# Patient Record
Sex: Male | Born: 1965 | Race: White | Hispanic: No | Marital: Married | Smoking: Never smoker
Health system: Southern US, Community
[De-identification: ages and names within clinical notes are randomized; demographics above are authoritative.]

## PROBLEM LIST (undated history)

## (undated) DIAGNOSIS — G8929 Other chronic pain: Secondary | ICD-10-CM

## (undated) DIAGNOSIS — E781 Pure hyperglyceridemia: Secondary | ICD-10-CM

## (undated) DIAGNOSIS — R42 Dizziness and giddiness: Secondary | ICD-10-CM

## (undated) DIAGNOSIS — M542 Cervicalgia: Secondary | ICD-10-CM

## (undated) HISTORY — DX: Dizziness and giddiness: R42

## (undated) HISTORY — PX: OTHER SURGICAL HISTORY: SHX169

## (undated) HISTORY — DX: Other chronic pain: G89.29

## (undated) HISTORY — DX: Cervicalgia: M54.2

## (undated) HISTORY — DX: Pure hyperglyceridemia: E78.1

---

## 1998-01-20 ENCOUNTER — Encounter: Admission: RE | Admit: 1998-01-20 | Discharge: 1998-04-20 | Payer: Self-pay | Admitting: Neurosurgery

## 2002-11-26 ENCOUNTER — Ambulatory Visit (HOSPITAL_COMMUNITY): Admission: RE | Admit: 2002-11-26 | Discharge: 2002-11-26 | Payer: Self-pay | Admitting: Emergency Medicine

## 2002-11-26 ENCOUNTER — Encounter: Payer: Self-pay | Admitting: Emergency Medicine

## 2004-02-14 ENCOUNTER — Emergency Department (HOSPITAL_COMMUNITY): Admission: EM | Admit: 2004-02-14 | Discharge: 2004-02-15 | Payer: Self-pay | Admitting: Emergency Medicine

## 2006-08-27 HISTORY — PX: COLONOSCOPY: SHX174

## 2007-09-10 ENCOUNTER — Emergency Department (HOSPITAL_COMMUNITY): Admission: EM | Admit: 2007-09-10 | Discharge: 2007-09-10 | Payer: Self-pay | Admitting: Emergency Medicine

## 2009-09-22 ENCOUNTER — Emergency Department (HOSPITAL_COMMUNITY): Admission: EM | Admit: 2009-09-22 | Discharge: 2009-09-22 | Payer: Self-pay | Admitting: Emergency Medicine

## 2010-10-19 ENCOUNTER — Ambulatory Visit
Admission: RE | Admit: 2010-10-19 | Discharge: 2010-10-19 | Payer: Self-pay | Source: Home / Self Care | Admitting: Family Medicine

## 2010-10-21 ENCOUNTER — Telehealth (INDEPENDENT_AMBULATORY_CARE_PROVIDER_SITE_OTHER): Payer: Self-pay | Admitting: *Deleted

## 2010-10-24 ENCOUNTER — Ambulatory Visit
Admission: RE | Admit: 2010-10-24 | Discharge: 2010-10-24 | Payer: Self-pay | Source: Home / Self Care | Admitting: Family Medicine

## 2010-11-23 NOTE — Assessment & Plan Note (Signed)
Summary: REMOVE SUTURES/TJ (procedure RM)   Vital Signs:  Patient Profile:   45 Years Old Male CC:      suture removal Height:     72 inches Weight:      186.50 pounds O2 Sat:      98 % O2 treatment:    Room Air Temp:     97.7 degrees F oral Pulse rate:   61 / minute Resp:     18 per minute BP sitting:   135 / 90  (left arm) Cuff size:   regular  Vitals Entered By: Clemens Catholic LPN (October 24, 2010 5:13 PM)                  Updated Prior Medication List: MULTIVITAMINS  TABS (MULTIPLE VITAMIN)   Current Allergies (reviewed today): No known allergies History of Present Illness Chief Complaint: suture removal History of Present Illness:  Subjective:  No complaints  REVIEW OF SYSTEMS Constitutional Symptoms      Denies fever, chills, night sweats, weight loss, weight gain, and fatigue.  Eyes       Denies change in vision, eye pain, eye discharge, glasses, contact lenses, and eye surgery. Ear/Nose/Throat/Mouth       Denies hearing loss/aids, change in hearing, ear pain, ear discharge, dizziness, frequent runny nose, frequent nose bleeds, sinus problems, sore throat, hoarseness, and tooth pain or bleeding.  Respiratory       Denies dry cough, productive cough, wheezing, shortness of breath, asthma, bronchitis, and emphysema/COPD.  Cardiovascular       Denies murmurs, chest pain, and tires easily with exhertion.    Gastrointestinal       Denies stomach pain, nausea/vomiting, diarrhea, constipation, blood in bowel movements, and indigestion. Genitourniary       Denies painful urination, kidney stones, and loss of urinary control. Neurological       Denies paralysis, seizures, and fainting/blackouts. Musculoskeletal       Denies muscle pain, joint pain, joint stiffness, decreased range of motion, redness, swelling, muscle weakness, and gout.  Skin       Denies bruising, unusual mles/lumps or sores, and hair/skin or nail changes.  Psych       Denies mood changes,  temper/anger issues, anxiety/stress, speech problems, depression, and sleep problems. Other Comments: pt is here today to have his sutures removed from his RT eye lid.   Past History:  Past Medical History: Reviewed history from 10/19/2010 and no changes required. Unremarkable  Past Surgical History: Reviewed history from 10/19/2010 and no changes required. right wrist repair right ankle right shoulder  Family History: Reviewed history from 10/19/2010 and no changes required. None  Social History: Reviewed history from 10/19/2010 and no changes required. Married Never Smoked Alcohol use-no Drug use-no Regular exercise-yes   Objective:  Right upper eyelid:  no swelling, erythema, or drainage.  Laceration well healed Assessment New Problems: ENCOUNTER FOR REMOVAL OF SUTURES (ICD-V58.32)   Plan New Orders: No Charge Patient Arrived (NCPA0) [NCPA0] Planning Comments:   Sutures removed.  Applied small amount of Bacitracin to healed wound.   The patient and/or caregiver has been counseled thoroughly with regard to medications prescribed including dosage, schedule, interactions, rationale for use, and possible side effects and they verbalize understanding.  Diagnoses and expected course of recovery discussed and will return if not improved as expected or if the condition worsens. Patient and/or caregiver verbalized understanding.   Orders Added: 1)  No Charge Patient Arrived (NCPA0) [NCPA0]

## 2010-11-23 NOTE — Assessment & Plan Note (Signed)
Summary: CUT TO R EYE LID/WB (proc. rm)   Vital Signs:  Patient Profile:   45 Years Old Male CC:      laceration to right eye lid x this am Height:     72 inches Weight:      183 pounds O2 Sat:      100 % O2 treatment:    Room Air Temp:     97.6 degrees F oral Pulse rate:   66 / minute Resp:     12 per minute BP sitting:   123 / 78  (left arm) Cuff size:   regular  Vitals Entered By: Lajean Saver RN (October 19, 2010 8:27 AM)              Vision Screening: Left eye w/o correction: 20 / 20 Right Eye w/o correction: 20 / 20 Both eyes w/o correction:  20/ 20  Color vision testing: normal      Vision Entered By: Lajean Saver RN (October 19, 2010 8:28 AM)    Updated Prior Medication List: MULTIVITAMINS  TABS (MULTIPLE VITAMIN)   Current Allergies: No known allergies History of Present Illness Chief Complaint: laceration to right eye lid x this am History of Present Illness:  Subjective:  While playing basketball this morning, patient was elbowed over the right eye, resulting in laceration of the right upper eyelid.  No changes in vision.  He believes that his last tetanus shot was within 5 years.  REVIEW OF SYSTEMS Constitutional Symptoms      Denies fever, chills, night sweats, weight loss, weight gain, and fatigue.  Eyes       Denies change in vision, eye pain, eye discharge, glasses, contact lenses, and eye surgery.      Comments: laceration to right eye lid Ear/Nose/Throat/Mouth       Denies hearing loss/aids, change in hearing, ear pain, ear discharge, dizziness, frequent runny nose, frequent nose bleeds, sinus problems, sore throat, hoarseness, and tooth pain or bleeding.  Respiratory       Denies dry cough, productive cough, wheezing, shortness of breath, asthma, bronchitis, and emphysema/COPD.  Cardiovascular       Denies murmurs, chest pain, and tires easily with exhertion.    Gastrointestinal       Denies stomach pain, nausea/vomiting, diarrhea,  constipation, blood in bowel movements, and indigestion. Genitourniary       Denies painful urination, kidney stones, and loss of urinary control. Neurological       Denies paralysis, seizures, and fainting/blackouts. Musculoskeletal       Denies muscle pain, joint pain, joint stiffness, decreased range of motion, redness, swelling, muscle weakness, and gout.  Skin       Denies bruising, unusual mles/lumps or sores, and hair/skin or nail changes.  Psych       Denies mood changes, temper/anger issues, anxiety/stress, speech problems, depression, and sleep problems. Other Comments: patient was elbowed in his right eye while playing basketball this AM. bleeding has stopped and the wound has been cleaned with hibicleanse   Past History:  Past Medical History: Unremarkable  Past Surgical History: right wrist repair right ankle right shoulder  Family History: None  Social History: Married Never Smoked Alcohol use-no Drug use-no Regular exercise-yes Smoking Status:  never Does Patient Exercise:  yes Drug Use:  no   Objective:  Appearance:  Patient appears healthy, stated age, and in no acute distress  Eyes:  Pupils are equal, round, and reactive to light and accomdation.  Extraocular movement is  intact.  Conjunctivae are not inflamed, and no injury apparent.  Fundi normal. Skin, face:  Right upper eyelid reveals a 1.5cm simple shallow laceration.  No facial swelling present.  Wound clean without debris Assessment New Problems: LACERATION, EYELID (ICD-870.0)   Plan New Orders: New Patient Level III [99203] Repair Superfical Wound(s) 2.5cm or< (face,ears,eyelids,nose,lips,mm) [12011] Planning Comments:   Return for any signs of infection.  Keep wound clean and dry.  Apply Bacitracin several times daily. Advised to check with his PCP for status of last tetanus immunization and return if not current. Return in 6 days for suture removal   The patient and/or caregiver has  been counseled thoroughly with regard to medications prescribed including dosage, schedule, interactions, rationale for use, and possible side effects and they verbalize understanding.  Diagnoses and expected course of recovery discussed and will return if not improved as expected or if the condition worsens. Patient and/or caregiver verbalized understanding.   PROCEDURE:  Suture Site: Right upper eyelid Size: 1.5cm Number of Lacerations: 1 Anesthesia: Local 1% lidocaine without epinephrine Procedure: Procedure:  Laceration Repair Discussed benefits and risks of procedure and verbal consent obtained. Using sterile technique and local 1% lidocaine without epinephrine, cleansed wound with Betadine followed by lavage with normal saline.  Wound carefully inspected for debris and foreign bodies; none found.  Wound closed with #4, 6-0 interrupted nylon sutures.  Bacitracin applied.  Wound precautions explained to patient.    Disposition: Home  Orders Added: 1)  New Patient Level III [81448] 2)  Repair Superfical Wound(s) 2.5cm or< (face,ears,eyelids,nose,lips,mm) [12011]

## 2010-11-23 NOTE — Progress Notes (Signed)
  Phone Note Outgoing Call   Call placed by: Clemens Catholic LPN,  October 21, 2010 6:03 PM Call placed to: Patient Summary of Call: call back: called to F/U with pt. he states that his eye is doing better. advised him to F/U 6 days after injury for suture removal. Initial call taken by: Clemens Catholic LPN,  October 21, 2010 6:04 PM

## 2014-09-21 ENCOUNTER — Encounter: Payer: Self-pay | Admitting: *Deleted

## 2014-09-22 ENCOUNTER — Encounter: Payer: Self-pay | Admitting: Cardiology

## 2014-09-22 ENCOUNTER — Ambulatory Visit (INDEPENDENT_AMBULATORY_CARE_PROVIDER_SITE_OTHER): Payer: 59 | Admitting: Cardiology

## 2014-09-22 VITALS — BP 144/98 | HR 66 | Resp 18 | Ht 71.0 in | Wt 186.4 lb

## 2014-09-22 DIAGNOSIS — E781 Pure hyperglyceridemia: Secondary | ICD-10-CM

## 2014-09-22 DIAGNOSIS — R079 Chest pain, unspecified: Secondary | ICD-10-CM

## 2014-09-22 NOTE — Patient Instructions (Addendum)
Your physician has requested that you have an exercise tolerance test. For further information please visit [email or web address]. Please also follow instruction sheet, as given.  Your physician has requested that you have an echocardiogram. Echocardiography is a painless test that uses sound waves to create images of your heart. It provides your doctor with information about the size and shape of your heart and how well your heart's chambers and valves are working. This procedure takes approximately one hour. There are no restrictions for this procedure.  Your physician recommends that you schedule a follow-up appointment AS NEEDED with Dr. Turner.   

## 2014-09-22 NOTE — Progress Notes (Signed)
  [address] [place], [place]  [address] Phone: [phone] Fax:  [phone]  Date:  09/22/2014   ID:  Tony Brady, DOB 1966/03/06, MRN [id number]  PCP:  Darrow BussingKOIRALA,DIBAS, MD  Cardiologist:  Armanda Magicraci Turner, MD    History of Present Illness: Tony Brady is a 48 y.o. male with a history of hypertriglyceridemia who recently was his PCP for wellness exam and complained of occasional CP that is dull and nonexertional.  It will be constant for a week at a time and then go away. This has been occurring for  Years since his 3020's.  There are no associated symptoms of nausea, diaphoresis or SOB.  EKG showed NSR with no ST changes.  He says that the discomfort is always on his left side.  He plays basketball and has no problems with CP.  He denies any SOB, DOE, palpitations, LE edema or syncope.   Wt Readings from Last 3 Encounters:  09/22/14 186 lb 6.4 oz (84.55 kg)  10/24/10 186 lb 8 oz (84.596 kg)  10/19/10 183 lb (83.008 kg)     Past Medical History  Diagnosis Date  . Chronic neck pain   . High triglycerides   . Vertigo     Current Outpatient Prescriptions  Medication Sig Dispense Refill  . Multiple Vitamins-Minerals (MULTIVITAMIN PO) Take 1 tablet by mouth daily.    . tadalafil (CIALIS) 10 MG tablet Take 10 mg by mouth daily as needed for erectile dysfunction.     No current facility-administered medications for this visit.   Allergies:   No Known Allergies  Social History:  The patient  reports that he has never smoked. He does not have any smokeless tobacco history on file. He reports that he drinks alcohol.   Family History:  The patient's family history includes COPD in his mother; Cancer in his father; Colon cancer in his mother; Emphysema in his father; Pneumonia in his father.   ROS:  Please see the history of present illness.      All other systems reviewed and negative.   PHYSICAL EXAM: VS:  There were no vitals taken for this visit. Well nourished, well  developed, in no acute distress HEENT: normal Neck: no JVD Cardiac:  normal S1, S2; RRR; no murmur Lungs:  clear to auscultation bilaterally, no wheezing, rhonchi or rales Abd: soft, nontender, no hepatomegaly Ext: no edema Skin: warm and dry Neuro:  CNs 2-12 intact, no focal abnormalities noted  ASSESSMENT/PLAN:  1. Atypical chest pain with CRF including hypertriglyceridemia, male sex and age 78>40.  His EKG is nonischemic. He has a history of gastropathy so this could be related to GI etiology.   Will get an ETT to rule out ischemia and check a 2D echo to assess LVF. 2.   Hypertriglyceridemia  Followup with me PRN pending results of studies  Signed, Armanda Magicraci Turner, MD [facility] 09/22/2014 4:01 PM

## 2014-10-04 ENCOUNTER — Ambulatory Visit (HOSPITAL_COMMUNITY): Payer: 59 | Attending: Cardiology

## 2014-10-04 DIAGNOSIS — I340 Nonrheumatic mitral (valve) insufficiency: Secondary | ICD-10-CM | POA: Insufficient documentation

## 2014-10-04 DIAGNOSIS — R079 Chest pain, unspecified: Secondary | ICD-10-CM | POA: Diagnosis present

## 2014-10-04 NOTE — Progress Notes (Signed)
2D Echo completed. 10/04/2014 

## 2014-10-06 ENCOUNTER — Telehealth: Payer: Self-pay | Admitting: General Practice

## 2014-10-06 ENCOUNTER — Telehealth: Payer: Self-pay | Admitting: Cardiology

## 2014-10-06 NOTE — Telephone Encounter (Signed)
New Msg      Pt returning call, Please contact at [phone].

## 2014-10-06 NOTE — Telephone Encounter (Signed)
Patient informed of ECHO results and verbal understanding expressed.   

## 2014-10-27 ENCOUNTER — Telehealth (HOSPITAL_COMMUNITY): Payer: Self-pay

## 2014-10-27 NOTE — Telephone Encounter (Signed)
Encounter complete. 

## 2014-10-29 ENCOUNTER — Ambulatory Visit (HOSPITAL_COMMUNITY)
Admission: RE | Admit: 2014-10-29 | Discharge: 2014-10-29 | Disposition: A | Discharge status: Home / Self Care | Payer: 59 | Source: Ambulatory Visit | Attending: Cardiology | Admitting: Cardiology

## 2014-10-29 DIAGNOSIS — R079 Chest pain, unspecified: Secondary | ICD-10-CM | POA: Diagnosis present

## 2014-10-29 NOTE — Procedures (Signed)
Exercise Treadmill Test Test  Exercise Tolerance Test Ordering MD: Armanda Magicraci Turner, MD    Unique Test No: 1  Treadmill:  1  Indication for ETT: chest pain - rule out ischemia  Contraindication to ETT: No   Stress Modality: exercise - treadmill  Cardiac Imaging Performed: non   Protocol: standard Bruce - maximal  Max BP:  192/79/  Max MPHR (bpm):  171 85% MPR (bpm):  145  MPHR obtained (bpm):  164 % MPHR obtained:  95  Reached 85% MPHR (min:sec):  Total Exercise Time (min-sec):  14  Workload in METS:  17.2 Borg Scale: 15  Reason ETT Terminated:  Fatigue and SOB    ST Segment Analysis At Rest: NSR, RVCD With Exercise: no evidence of significant ST depression  Other Information Arrhythmia:  Isolated ventricular couplet. Angina during ETT:  absent (0) Quality of ETT:  diagnostic  ETT Interpretation:  normal - no evidence of ischemia by ST analysis  Comments: ETT with excellent exercise tolerance (14:00); normal BP response; no chest pain; no ST changes; negative adequate ETT. Tony MillersBrian Kainoa Brady

## 2014-11-21 ENCOUNTER — Emergency Department: Admission: EM | Admit: 2014-11-21 | Discharge: 2014-11-21 | Discharge status: Absent without leave | Payer: Self-pay

## 2016-10-01 ENCOUNTER — Encounter: Payer: Self-pay | Admitting: *Deleted

## 2016-10-01 ENCOUNTER — Emergency Department
Admission: EM | Admit: 2016-10-01 | Discharge: 2016-10-01 | Disposition: A | Discharge status: Home / Self Care | Payer: BLUE CROSS/BLUE SHIELD | Source: Home / Self Care | Attending: Family Medicine | Admitting: Family Medicine

## 2016-10-01 DIAGNOSIS — B9789 Other viral agents as the cause of diseases classified elsewhere: Secondary | ICD-10-CM

## 2016-10-01 DIAGNOSIS — J069 Acute upper respiratory infection, unspecified: Secondary | ICD-10-CM

## 2016-10-01 MED ORDER — BENZONATATE 100 MG PO CAPS: 100.0000 mg | ORAL_CAPSULE | Freq: Three times a day (TID) | ORAL | 0 refills | Status: DC

## 2016-10-01 MED ORDER — AZITHROMYCIN 250 MG PO TABS: 250.0000 mg | ORAL_TABLET | Freq: Every day | ORAL | 0 refills | Status: DC

## 2016-10-01 MED ORDER — GUAIFENESIN ER 600 MG PO TB12: 600.0000 mg | ORAL_TABLET | Freq: Two times a day (BID) | ORAL | 0 refills | Status: DC

## 2016-10-01 MED ORDER — FLUTICASONE PROPIONATE 50 MCG/ACT NA SUSP: 2.0000 | Freq: Every day | NASAL | 2 refills | Status: DC

## 2016-10-01 NOTE — Discharge Instructions (Signed)
°  Your symptoms are likely due to a virus such as the common cold, however, if you developing worsening chest congestion with shortness of breath, persistent fever for 3 days, or symptoms not improving in 4-5 days, you may fill the antibiotic (azithromycin).  If you do fill the antibiotic,  please take antibiotics as prescribed and be sure to complete entire course even if you start to feel better to ensure infection does not come back. ° °

## 2016-10-01 NOTE — ED Triage Notes (Signed)
Patient c/o 5 days of dry cough and low grade fever @ night. Taken generic allergy relief and tylenol otc.

## 2016-10-01 NOTE — ED Provider Notes (Signed)
CSN: [id number]     Arrival date & time 10/01/16  1152 History   First MD Initiated Contact with Patient 10/01/16 1211     Chief Complaint  Patient presents with  . Cough   (Consider location/radiation/quality/duration/timing/severity/associated sxs/prior Treatment) HPI  Tony Brady is a 50 y.o. male presenting to UC with c/o 5 days of mildly productive cough that is worse at night.  Subjective low-grade fever at night.  He has taken generic allergy relief and Tylenol with some relief. Denies nasal congestion as he notes he has been taking the allergy medication. He notes his wife was sick a few weeks ago but is better now. Denies n/v/d. Denies chest pain or SOB. Denies ear pain or sore throat.    Past Medical History:  Diagnosis Date  . Chronic neck pain   . High triglycerides   . Vertigo    Past Surgical History:  Procedure Laterality Date  . COLONOSCOPY  08/27/2006   normal   . GE junction     Erythematous gastropathy   Family History  Problem Relation Age of Onset  . Colon cancer Mother   . COPD Mother   . Pneumonia Father   . Cancer Father   . Emphysema Father    Social History  Substance Use Topics  . Smoking status: Never Smoker  . Smokeless tobacco: Never Used  . Alcohol use Yes     Comment: rare, one glass a year    Review of Systems  Constitutional: Positive for fever ( subjective). Negative for chills.  HENT: Positive for congestion ( minimal). Negative for ear pain, sore throat, trouble swallowing and voice change.   Respiratory: Positive for cough. Negative for shortness of breath.   Cardiovascular: Negative for chest pain and palpitations.  Gastrointestinal: Negative for abdominal pain, diarrhea, nausea and vomiting.  Musculoskeletal: Negative for arthralgias, back pain and myalgias.  Skin: Negative for rash.    Allergies  Patient has no known allergies.  Home Medications   Prior to Admission medications   Medication Sig Start Date End Date  Taking? Authorizing Provider  azithromycin (ZITHROMAX) 250 MG tablet Take 1 tablet (250 mg total) by mouth daily. Take first 2 tablets together, then 1 every day until finished. 10/01/16   Junius FinnerErin O'Malley, PA-C  benzonatate (TESSALON) 100 MG capsule Take 1-2 capsules (100-200 mg total) by mouth every 8 (eight) hours. 10/01/16   Junius FinnerErin O'Malley, PA-C  fluticasone (FLONASE) 50 MCG/ACT nasal spray Place 2 sprays into both nostrils daily. 10/01/16   Junius FinnerErin O'Malley, PA-C  guaiFENesin (MUCINEX) 600 MG 12 hr tablet Take 1-2 tablets (600-1,200 mg total) by mouth 2 (two) times daily. Take with large glass of water 10/01/16   Junius FinnerErin O'Malley, PA-C   Meds Ordered and Administered this Visit  Medications - No data to display  BP 137/80 (BP Location: Left Arm)   Pulse 66   Temp 97.7 F (36.5 C) (Oral)   Resp 16   Wt 183 lb (83 kg)   SpO2 96%   BMI 25.52 kg/m  No data found.   Physical Exam  Constitutional: He is oriented to person, place, and time. He appears well-developed and well-nourished. No distress.  HENT:  Head: Normocephalic and atraumatic.  Right Ear: Tympanic membrane normal.  Left Ear: Tympanic membrane normal.  Nose: Nose normal. Right sinus exhibits no maxillary sinus tenderness and no frontal sinus tenderness. Left sinus exhibits no maxillary sinus tenderness and no frontal sinus tenderness.  Mouth/Throat: Uvula is midline, oropharynx is clear  and moist and mucous membranes are normal.  Eyes: EOM are normal.  Neck: Normal range of motion. Neck supple.  Cardiovascular: Normal rate and regular rhythm.   Pulmonary/Chest: Effort normal and breath sounds normal. No respiratory distress. He has no wheezes. He has no rales.  Musculoskeletal: Normal range of motion.  Neurological: He is alert and oriented to person, place, and time.  Skin: Skin is warm and dry. He is not diaphoretic.  Psychiatric: He has a normal mood and affect. His behavior is normal.  Nursing note and vitals  reviewed.   Urgent Care Course   Clinical Course     Procedures (including critical care time)  Labs Review Labs Reviewed - No data to display  Imaging Review No results found.   MDM   1. Viral URI with cough    Pt c/o 5 days of minimally productive cough and mild congestion.  No evidence of bacterial infection at this time. Reassured pt symptoms are likely viral.  Encouraged symptomatic treatment.  Rx: Tessalon, Flonase, and Mucinex Prescription to hold with expiration date for Azithromycin provided. F/u with PCP in 7-10 days if not improving, sooner if worsening.     Junius Finnerrin O'Malley, PA-C 10/01/16 1232

## 2016-10-29 ENCOUNTER — Emergency Department
Admission: EM | Admit: 2016-10-29 | Discharge: 2016-10-29 | Disposition: A | Discharge status: Home / Self Care | Payer: BLUE CROSS/BLUE SHIELD | Source: Home / Self Care | Attending: Family Medicine | Admitting: Family Medicine

## 2016-10-29 DIAGNOSIS — J029 Acute pharyngitis, unspecified: Secondary | ICD-10-CM | POA: Diagnosis not present

## 2016-10-29 LAB — POCT RAPID STREP A (OFFICE): Rapid Strep A Screen: NEGATIVE

## 2016-10-29 NOTE — ED Triage Notes (Signed)
Started Friday with sore throat, fever, chills.  Has been taking tylenol.  Sunday had upset stomach and hard to swallow.

## 2016-10-29 NOTE — Discharge Instructions (Signed)
°  Your symptoms are likely viral in nature.  It is important to stay well hydrated.  You may alternate acetaminophen and ibuprofen as needed for fever and pain.  You may also try over the counter chloraseptic throat spray or throat lozenges to help with throat pain. You may run a humidifier at night to help prevent your throat from becoming dry, which could cause more pain.  A soft diet with warm broth, ice chips, ice pops, jello, and soft noodles are good to have while your throat is still sore.

## 2016-10-29 NOTE — ED Provider Notes (Signed)
CSN: [id number]     Arrival date & time 10/29/16  0944 History   First MD Initiated Contact with Patient 10/29/16 1011     Chief Complaint  Patient presents with  . Sore Throat  . Fever   (Consider location/radiation/quality/duration/timing/severity/associated sxs/prior Treatment) HPI Frankey ShownRichard Zilberman is a 51 y.o. male presenting to UC with c/o sore throat, subjective fever with chills, generalized headache and nausea for 3 days.  He has been taking Tylenol with mild temporary relief.  Throat pain is worse with swallowing but he has been able to keep down fluids.  Denies vomiting or diarrhea. Others at work have been sick. No recent travel.    Past Medical History:  Diagnosis Date  . Chronic neck pain   . High triglycerides   . Vertigo    Past Surgical History:  Procedure Laterality Date  . COLONOSCOPY  08/27/2006   normal   . GE junction     Erythematous gastropathy   Family History  Problem Relation Age of Onset  . Colon cancer Mother   . COPD Mother   . Pneumonia Father   . Cancer Father   . Emphysema Father    Social History  Substance Use Topics  . Smoking status: Never Smoker  . Smokeless tobacco: Never Used  . Alcohol use Yes     Comment: rare, one glass a year    Review of Systems  Constitutional: Positive for chills, fatigue and fever.  HENT: Positive for sore throat. Negative for congestion, ear pain, rhinorrhea, sinus pain and sinus pressure.   Respiratory: Negative for cough and shortness of breath.   Gastrointestinal: Positive for nausea. Negative for diarrhea and vomiting.  Musculoskeletal: Negative for arthralgias and joint swelling.  Neurological: Positive for headaches. Negative for dizziness and light-headedness.    Allergies  Patient has no known allergies.  Home Medications   Prior to Admission medications   Medication Sig Start Date End Date Taking? Authorizing Provider  azithromycin (ZITHROMAX) 250 MG tablet Take 1 tablet (250 mg total) by  mouth daily. Take first 2 tablets together, then 1 every day until finished. 10/01/16   Junius FinnerErin O'Malley, PA-C  benzonatate (TESSALON) 100 MG capsule Take 1-2 capsules (100-200 mg total) by mouth every 8 (eight) hours. 10/01/16   Junius FinnerErin O'Malley, PA-C  fluticasone (FLONASE) 50 MCG/ACT nasal spray Place 2 sprays into both nostrils daily. 10/01/16   Junius FinnerErin O'Malley, PA-C  guaiFENesin (MUCINEX) 600 MG 12 hr tablet Take 1-2 tablets (600-1,200 mg total) by mouth 2 (two) times daily. Take with large glass of water 10/01/16   Junius FinnerErin O'Malley, PA-C   Meds Ordered and Administered this Visit  Medications - No data to display  BP 137/84 (BP Location: Left Arm)   Pulse 67   Temp 98.2 F (36.8 C) (Oral)   Ht 5\' 11"  (1.803 m)   Wt 183 lb 6.4 oz (83.2 kg)   SpO2 98%   BMI 25.58 kg/m  No data found.   Physical Exam  Constitutional: He is oriented to person, place, and time. He appears well-developed and well-nourished. No distress.  HENT:  Head: Normocephalic and atraumatic.  Right Ear: Tympanic membrane normal.  Left Ear: Tympanic membrane normal.  Nose: Nose normal.  Mouth/Throat: Uvula is midline and mucous membranes are normal. Posterior oropharyngeal erythema present. No oropharyngeal exudate, posterior oropharyngeal edema or tonsillar abscesses.  Eyes: EOM are normal.  Neck: Normal range of motion. Neck supple.  Cardiovascular: Normal rate and regular rhythm.   Pulmonary/Chest: Effort normal  and breath sounds normal. No stridor. No respiratory distress. He has no wheezes. He has no rales.  Musculoskeletal: Normal range of motion.  Lymphadenopathy:    He has cervical adenopathy.  Neurological: He is alert and oriented to person, place, and time.  Skin: Skin is warm and dry. He is not diaphoretic.  Psychiatric: He has a normal mood and affect. His behavior is normal.  Nursing note and vitals reviewed.   Urgent Care Course   Clinical Course     Procedures (including critical care  time)  Labs Review Labs Reviewed  STREP A DNA PROBE  POCT RAPID STREP A (OFFICE)    Imaging Review No results found.    MDM   1. Viral pharyngitis    Pt c/o sore throat, headache, and subjective fever for about 3 days.  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   No evidence of tonsillar abscess.  Rapid strep: Negative Will send culture.   Encouraged symptomatic treatment for viral illness. Fluids, acetaminophen, ibuprofen, salt water gargles, and throat lozenges.  F/u with PCP in 1 week if not improving.    Junius Finner, PA-C 10/29/16 1125

## 2016-10-30 ENCOUNTER — Telehealth: Payer: Self-pay | Admitting: *Deleted

## 2016-10-30 LAB — STREP A DNA PROBE: GASP: NOT DETECTED

## 2016-10-30 NOTE — Telephone Encounter (Signed)
LM with Tcx results and to call back if he has any questions or concerns. Tarisa Paola, LPN  

## 2017-09-06 ENCOUNTER — Other Ambulatory Visit: Payer: Self-pay

## 2017-09-06 ENCOUNTER — Emergency Department
Admission: EM | Admit: 2017-09-06 | Discharge: 2017-09-06 | Disposition: A | Discharge status: Home / Self Care | Payer: BLUE CROSS/BLUE SHIELD | Source: Home / Self Care | Attending: Family Medicine | Admitting: Family Medicine

## 2017-09-06 ENCOUNTER — Emergency Department (INDEPENDENT_AMBULATORY_CARE_PROVIDER_SITE_OTHER): Payer: BLUE CROSS/BLUE SHIELD

## 2017-09-06 ENCOUNTER — Encounter: Payer: Self-pay | Admitting: Emergency Medicine

## 2017-09-06 DIAGNOSIS — X58XXXA Exposure to other specified factors, initial encounter: Secondary | ICD-10-CM

## 2017-09-06 DIAGNOSIS — S8252XA Displaced fracture of medial malleolus of left tibia, initial encounter for closed fracture: Secondary | ICD-10-CM | POA: Diagnosis not present

## 2017-09-06 DIAGNOSIS — S99912A Unspecified injury of left ankle, initial encounter: Secondary | ICD-10-CM | POA: Diagnosis not present

## 2017-09-06 NOTE — ED Provider Notes (Signed)
[facility]    CSN: [id number] Arrival date & time: 09/06/17  1101     History   Chief Complaint Chief Complaint  Patient presents with  . Ankle Pain    HPI Tony Brady is a 51 y.o. male.   HPI Tony Brady is a 51 y.o. male presenting to UC with c/o Left ankle pain that started about 11 weeks ago while playing basketball. He has injured it several times in the past so he initially thought he had a mild sprain. He used an ankle brace and crutches for a few weeks. Pain started to improve but then when he went back to playing basketball his ankle started to hurt again, despite using an ankle brace.  He is requesting an x-ray to make sure he does not have a stress fracture.    Past Medical History:  Diagnosis Date  . Chronic neck pain   . High triglycerides   . Vertigo     Patient Active Problem List   Diagnosis Date Noted  . Chest pain 09/22/2014  . Hypertriglyceridemia 09/22/2014    Past Surgical History:  Procedure Laterality Date  . COLONOSCOPY  08/27/2006   normal   . GE junction     Erythematous gastropathy       Home Medications    Prior to Admission medications   Medication Sig Start Date End Date Taking? Authorizing Provider  azithromycin (ZITHROMAX) 250 MG tablet Take 1 tablet (250 mg total) by mouth daily. Take first 2 tablets together, then 1 every day until finished. 10/01/16   Lurene ShadowPhelps, Saud Bail O, PA-C  benzonatate (TESSALON) 100 MG capsule Take 1-2 capsules (100-200 mg total) by mouth every 8 (eight) hours. 10/01/16   Lurene ShadowPhelps, Rebekah Zackery O, PA-C  fluticasone (FLONASE) 50 MCG/ACT nasal spray Place 2 sprays into both nostrils daily. 10/01/16   Lurene ShadowPhelps, Shey Bartmess O, PA-C  guaiFENesin (MUCINEX) 600 MG 12 hr tablet Take 1-2 tablets (600-1,200 mg total) by mouth 2 (two) times daily. Take with large glass of water 10/01/16   Lurene ShadowPhelps, Zanobia Griebel O, PA-C    Family History Family History  Problem Relation Age of Onset  . Colon cancer Mother   . COPD Mother   .  Pneumonia Father   . Cancer Father   . Emphysema Father     Social History Social History   Tobacco Use  . Smoking status: Never Smoker  . Smokeless tobacco: Never Used  Substance Use Topics  . Alcohol use: Yes    Comment: rare, one glass a year  . Drug use: Not on file     Allergies   Patient has no known allergies.   Review of Systems Review of Systems  Musculoskeletal: Positive for arthralgias, gait problem, joint swelling and myalgias.  Skin: Negative for color change and wound.  Neurological: Negative for weakness and numbness.     Physical Exam Triage Vital Signs ED Triage Vitals  Enc Vitals Group     BP 09/06/17 1121 134/87     Pulse Rate 09/06/17 1121 64     Resp --      Temp 09/06/17 1121 97.8 F (36.6 C)     Temp Source 09/06/17 1121 Oral     SpO2 09/06/17 1121 98 %     Weight 09/06/17 1122 185 lb (83.9 kg)     Height --      Head Circumference --      Peak Flow --      Pain Score 09/06/17 1122 3  Pain Loc --      Pain Edu? --      Excl. in GC? --    No data found.  Updated Vital Signs BP 134/87 (BP Location: Right Arm)   Pulse 64   Temp 97.8 F (36.6 C) (Oral)   Wt 185 lb (83.9 kg)   SpO2 98%   BMI 25.80 kg/m   Visual Acuity Right Eye Distance:   Left Eye Distance:   Bilateral Distance:    Right Eye Near:   Left Eye Near:    Bilateral Near:     Physical Exam  Constitutional: He is oriented to person, place, and time. He appears well-developed and well-nourished. No distress.  HENT:  Head: Normocephalic and atraumatic.  Eyes: EOM are normal.  Neck: Normal range of motion.  Cardiovascular: Normal rate.  Pulses:      Dorsalis pedis pulses are 2+ on the left side.       Posterior tibial pulses are 2+ on the left side.  Pulmonary/Chest: Effort normal.  Musculoskeletal: Normal range of motion. He exhibits edema and tenderness.  Left ankle and foot: minimal edema to anterior aspect of ankle. tenderness to anterior ankle. Full  ROM w/o crepitus. Calf is soft, non-tender. Full ROM knee, ankle and toes   Neurological: He is alert and oriented to person, place, and time.  Skin: Skin is warm and dry. He is not diaphoretic.  Left ankle: skin in tact. No ecchymosis or erythema.   Psychiatric: He has a normal mood and affect. His behavior is normal.  Nursing note and vitals reviewed.    UC Treatments / Results  Labs (all labs ordered are listed, but only abnormal results are displayed) Labs Reviewed - No data to display  EKG  EKG Interpretation None       Radiology Dg Ankle Complete Left  Result Date: 09/06/2017 CLINICAL DATA:  C/o Chronic LT ankle pain, hx injury playing basketball x 3 months ago. EXAM: LEFT ANKLE COMPLETE - 3+ VIEW COMPARISON:  None. FINDINGS: Old avulsion fracture fragment immediately subjacent to the medial malleolus. No acute appearing fracture line or displaced fracture fragment. Ankle mortise is symmetric. No degenerative change seen. Visualized portions of the hindfoot and midfoot appear intact and normally aligned. Adjacent soft tissues are unremarkable. IMPRESSION: 1. No acute findings. 2. Chronic small avulsion fracture fragment underlying the medial malleolus. Electronically Signed   By: Bary RichardStan  Maynard M.D.   On: 09/06/2017 11:55    Procedures Procedures (including critical care time)  Medications Ordered in UC Medications - No data to display   Initial Impression / Assessment and Plan / UC Course  I have reviewed the triage vital signs and the nursing notes.  Pertinent labs & imaging results that were available during my care of the patient were reviewed by me and considered in my medical decision making (see chart for details).     Hx and exam c/w chronic avulsion fracture of medial malleolus. Discussed imaging with pt He has several ankle splints and crutches at home he can use as needed.  Encouraged f/u with orthopedist or Sports Medicine. Pt has [facility] several years ago for surgery on Right ankle. He may f/u with them if he would like.   Final Clinical Impressions(s) / UC Diagnoses   Final diagnoses:  Left ankle injury, initial encounter    ED Discharge Orders    None       Controlled Substance Prescriptions [place] Controlled Substance Registry consulted?  Not Applicable   Rolla Plate 09/06/17 1730

## 2017-09-06 NOTE — ED Triage Notes (Signed)
Pt c/o left ankle pain after playing basketball last week. States he has injured that ankle many times. Also c/o bilateral ear fullness x1 month.

## 2017-10-18 ENCOUNTER — Ambulatory Visit: Payer: BLUE CROSS/BLUE SHIELD | Admitting: Family Medicine

## 2017-10-18 ENCOUNTER — Encounter: Payer: Self-pay | Admitting: Family Medicine

## 2017-10-18 VITALS — BP 126/85 | HR 67 | Ht 71.5 in | Wt 190.0 lb

## 2017-10-18 DIAGNOSIS — M25572 Pain in left ankle and joints of left foot: Secondary | ICD-10-CM

## 2017-10-18 MED ORDER — DICLOFENAC SODIUM 1 % TD GEL: 4.0000 g | Freq: Four times a day (QID) | TRANSDERMAL | 11 refills | Status: DC

## 2017-10-18 NOTE — Progress Notes (Signed)
Subjective:    I'm seeing this patient as a consultation for:  Tony RocherErin Phelps PA-C  CC: Ankle Pain  HPI: Jahdiel suffered an inversion injury to left ankle about 4 months ago.  He has a history of repeated ankle injuries in the past.  He denies any locking or catching but does note pain is worse with activity.  He notes pain and swelling worse with activity especially with basketball.  He has had to decrease his exercise due to the pain.  He has tried over-the-counter medications for pain as well as crutches and splints.  He has been doing a physician directed rehab program for over 6 weeks now and has not noted any improvement.  X-rays obtained in urgent care on November 16 showed an old avulsion fracture at the medial malleolus as well as some mild DJD.  No acute fractures were seen.  Past medical history, Surgical history, Family history not pertinant except as noted below, Social history, Allergies, and medications have been entered into the medical record, reviewed, and no changes needed.   Review of Systems: No headache, visual changes, nausea, vomiting, diarrhea, constipation, dizziness, abdominal pain, skin rash, fevers, chills, night sweats, weight loss, swollen lymph nodes, body aches, joint swelling, muscle aches, chest pain, shortness of breath, mood changes, visual or auditory hallucinations.   Objective:    Vitals:   10/18/17 0848  BP: 126/85  Pulse: 67   General: Well Developed, well nourished, and in no acute distress.  Neuro/Psych: Alert and oriented x3, extra-ocular muscles intact, able to move all 4 extremities, sensation grossly intact. Skin: Warm and dry, no rashes noted.  Respiratory: Not using accessory muscles, speaking in full sentences, trachea midline.  Cardiovascular: Pulses palpable, no extremity edema. Abdomen: Does not appear distended. MSK: Left ankle mild effusion. Normal range of motion. Mildly tender to palpation at the lateral and medial  malleoli. Stable ligamentous exam. Pulses capillary refill and sensation are intact.    CLINICAL DATA:  C/o Chronic LT ankle pain, hx injury playing basketball x 3 months ago.  EXAM: LEFT ANKLE COMPLETE - 3+ VIEW  COMPARISON:  None.  FINDINGS: Old avulsion fracture fragment immediately subjacent to the medial malleolus. No acute appearing fracture line or displaced fracture fragment. Ankle mortise is symmetric. No degenerative change seen. Visualized portions of the hindfoot and midfoot appear intact and normally aligned. Adjacent soft tissues are unremarkable.  IMPRESSION: 1. No acute findings. 2. Chronic small avulsion fracture fragment underlying the medial malleolus.   Electronically Signed   By: Bary RichardStan  Maynard M.D.   On: 09/06/2017 11:55  Impression and Recommendations:    Assessment and Plan: 51 y.o. male with left ankle pain in the setting of repeated injury.  This is concerning for osteochondral injury or significant ligamentous injury.  Plan for MRI to further characterize potential cartilage injury as well as for potential injection planning her surgical planning.  Patient has failed conservative management with greater than 6 weeks of physician directed rehab as well as pain medications and bracing.  We will treat empirically with diclofenac gel until MRI follow-up.   Orders Placed This Encounter  Procedures  . MR ANKLE LEFT WO CONTRAST    Standing Status:   Future    Standing Expiration Date:   12/19/2018    Order Specific Question:   What is the patient's sedation requirement?    Answer:   No Sedation    Order Specific Question:   Does the patient have a pacemaker or implanted  devices?    Answer:   No    Order Specific Question:   Preferred [facility]?    [facility]    Order Specific Question:   Radiology Contrast Protocol - do NOT remove file path    Answer:    [email or web address]   Meds ordered this encounter  Medications  . diclofenac sodium (VOLTAREN) 1 % GEL    Sig: Apply 4 g topically 4 (four) times daily. To affected joint.    Dispense:  100 g    Refill:  11    Discussed warning signs or symptoms. Please see discharge instructions. Patient expresses understanding.

## 2017-10-18 NOTE — Patient Instructions (Signed)
Thank you for coming in today. You should hear about MRI soon.  Let me know if you do not hear anything in 1 week.  Apply voltaren gel to the ankle up to 4x daily as needed Use an ankle compression sleeve during and for 30 mins following exercise. [employer] makes a good product.   Follow up a few days after the MRI to go over results.

## 2017-10-28 ENCOUNTER — Ambulatory Visit (INDEPENDENT_AMBULATORY_CARE_PROVIDER_SITE_OTHER): Payer: BLUE CROSS/BLUE SHIELD

## 2017-10-28 DIAGNOSIS — M25572 Pain in left ankle and joints of left foot: Secondary | ICD-10-CM | POA: Diagnosis not present

## 2017-11-05 ENCOUNTER — Encounter: Payer: Self-pay | Admitting: Family Medicine

## 2017-11-05 ENCOUNTER — Ambulatory Visit: Payer: BLUE CROSS/BLUE SHIELD | Admitting: Family Medicine

## 2017-11-05 DIAGNOSIS — M7670 Peroneal tendinitis, unspecified leg: Secondary | ICD-10-CM | POA: Insufficient documentation

## 2017-11-05 DIAGNOSIS — M76829 Posterior tibial tendinitis, unspecified leg: Secondary | ICD-10-CM | POA: Insufficient documentation

## 2017-11-05 DIAGNOSIS — M76821 Posterior tibial tendinitis, right leg: Secondary | ICD-10-CM

## 2017-11-05 DIAGNOSIS — M7671 Peroneal tendinitis, right leg: Secondary | ICD-10-CM

## 2017-11-05 NOTE — Progress Notes (Signed)
Tony ShownRichard Brady is a 52 y.o. male who [facility] today for left ankle pain.   Tony Brady was seen a few weeks ago for chronic ankle pain thought to be due to OCD lesion vs tendonitis.  He had an MRI with subsequently showed a tiny talar dome OCD as well as moderate posterior tibialis and peroneal tendinopathy without tear. He has continued to use an ankle brace which helps.    Past Medical History:  Diagnosis Date  . Chronic neck pain   . High triglycerides   . Vertigo    Past Surgical History:  Procedure Laterality Date  . COLONOSCOPY  08/27/2006   normal   . GE junction     Erythematous gastropathy   Social History   Tobacco Use  . Smoking status: Never Smoker  . Smokeless tobacco: Never Used  Substance Use Topics  . Alcohol use: Yes    Comment: rare, one glass a year     ROS:  As above   Medications: No current outpatient medications on file.   No current facility-administered medications for this visit.    No Known Allergies   Exam:  BP 130/79   Pulse (!) 56   Ht 5' 11.5" (1.816 m)   Wt 191 lb (86.6 kg)   BMI 26.27 kg/m  General: Well Developed, well nourished, and in no acute distress.  Neuro/Psych: Alert and oriented x3, extra-ocular muscles intact, able to move all 4 extremities, sensation grossly intact. Skin: Warm and dry, no rashes noted.  Respiratory: Not using accessory muscles, speaking in full sentences, trachea midline.  Cardiovascular: Pulses palpable, no extremity edema. Abdomen: Does not appear distended. MSK: Left ankle. No effusion  Mild TTP medial and lateral malleolus at the posterior aspect.     CLINICAL DATA:  Chronic medial and lateral ankle pain for the past 3-4 months. Concern for osteochondral injury.  EXAM: MRI OF THE LEFT ANKLE WITHOUT CONTRAST  TECHNIQUE: Multiplanar, multisequence MR imaging of the ankle was performed. No intravenous contrast was  administered.  COMPARISON:  Left ankle x-rays dated September 06, 2017.  FINDINGS: TENDONS  Peroneal: Intact peroneus longus and peroneus brevis tendons. Small amount of fluid in the peroneus brevis tendon sheath.  Posteromedial: Moderate tenosynovitis of the tibialis posterior tendon, with mild tendinosis distally. Intact flexor digitorum longus and flexor hallucis longus tendons. Small amount of fluid in the flexor hallucis longus tendon sheath behind the tibiotalar joint.  Anterior: Intact tibialis anterior, extensor hallucis longus and extensor digitorum longus tendons.  Achilles: Intact.  Plantar Fascia: Intact.  LIGAMENTS  Lateral: Attenuation of the anterior talofibular ligament, consistent with prior injury. The posterior talofibular, calcaneofibular, and anterior and posterior tibiofibular ligaments are intact.  Medial: The deltoid and visualized portions of the spring ligament are intact.  CARTILAGE  Ankle Joint: Small tibiotalar joint effusion. Tiny focus of subchondral marrow edema in the lateral talar dome. The tibial plafond is intact.  Subtalar Joints/Sinus Tarsi: Unremarkable. Normal signal in the sinus tarsi.  Bones: Small foci of marrow edema in the medial distal fibula and posterior aspect of the medial malleolus are favored reactive. No fracture or dislocation. Apparent increased signal within the distal fifth metatarsal shaft and head is likely due to poor fat saturation.  Other: No fluid collection or hematoma.  IMPRESSION: 1. Tiny focus of subchondral marrow edema in the lateral talar dome may represent a tiny osteochondral lesion. 2. Moderate tibialis posterior tenosynovitis. Mild tendinosis of the distal tendon.  3. Mild peroneus brevis tenosynovitis. 4. A small amount of fluid in the flexor hallucis longus tendon sheath likely reflects the small tibiotalar joint effusion. 5. Old anterior talofibular ligament  injury.   Electronically Signed   By: Obie Dredge M.D.   On: 10/28/2017 15:56    Assessment and Plan: 52 y.o. male with Left ankle pain. The pain at this point is most likely due to the tendonitis of the peroneal and posterior tibialis tendons.  The tiny OCD lesion is likely not significant.  We discussed options.  Plan for referral to PT and HEP.  If not better next steps would be injection.  Continue brace with activity.     Orders Placed This Encounter  Procedures  . Ambulatory referral to Physical Therapy    Referral Priority:   Routine    Referral Type:   Physical Medicine    Referral Reason:   Specialty Services Required    Requested Specialty:   Physical Therapy   No orders of the defined types were placed in this encounter.   Discussed warning signs or symptoms. Please see discharge instructions. Patient expresses understanding.  I spent 25 minutes with this patient, greater than 50% was face-to-face time counseling regarding ddx and treatment plan.

## 2017-11-05 NOTE — Patient Instructions (Signed)
Thank you for coming in today. Restart home exercises for ankle tendonitis.  Attend PT.  If not better next step is injection.  Ok to play basketball if pain is reasonably controlled.   Peroneal Tendinopathy Peroneal tendinopathy is irritation of the tendons that pass behind your ankle (peroneal tendons). These tendons attach muscles in your foot to a bone on the side of your foot and underneath the arch of your foot. This condition can cause your peroneal tendons to get bigger and swell. What are the causes? This condition may be caused by:  Putting stress on your ankle over and over again (overuse injury).  A sudden injury that puts stress on your tendons, such as an ankle sprain.  What increases the risk? This condition is more likely to develop in:  People who have high arches.  Athletes who play sports that involve putting stress on the ankle over and over again. These sports include: ? Running. ? Dancing. ? Soccer. ? Basketball.  What are the signs or symptoms? Symptoms of this condition can start suddenly or develop gradually. Symptoms include:  Pain in the back of the ankle, on the side of the foot, or in the arch of the foot.  Pain that gets worse with activity and better with rest.  Swelling.  Warmth.  Weakness in your foot or ankle.  How is this diagnosed? This condition may be diagnosed based on:  Your symptoms.  Your medical history.  A physical exam.  Imaging tests, such as: ? An X-ray or CT scan to check for bone injury. ? MRI or ultrasound to check for muscle or tendon injury.  During your physical exam, your health care provider may move your foot and ankle and test the strength of your leg muscles. How is this treated? This condition may be treated by:  Keeping your body weight off your ankle for several days.  Returning gradually to full activity gradually.  Putting ice on your ankle to reduce swelling.  Taking an anti-inflammatory pain  medicine (NSAID).  Having medicine injected into your tendon to reduce swelling.  Wearing a removable boot or brace for ankle support.  Doing range-of-motion exercises and strengthening exercises (physical therapy) when pain and swelling improve.  If the condition does not improve with treatment, or if a tendon or muscle is damaged, surgery may be needed. Follow these instructions at home: If you have a boot or brace:  Wear it as told by your health care provider. Remove it only as told by your health care provider.  Loosen it if your toes tingle, become numb, or turn cold and blue.  Do not let it get wet if it is not waterproof.  Keep it clean. Managing pain, stiffness, and swelling  If directed, apply ice to the injured area: ? Put ice in a plastic bag. ? Place a towel between your skin and the bag. ? Leave the ice on for 20 minutes, 2-3 times a day.  Take over-the-counter and prescription medicines only as told by your health care provider.  Raise (elevate) your ankle above the level of your heart when resting if you have swelling. Activity  Do not use your ankle to support (bear) your full body weight until your health care provider says that you can.  Do not do activities that make pain or swelling worse.  Return to your normal activities as told by your health care provider. General instructions  Keep all follow-up visits as told by your health care  provider. This is important. How is this prevented?  Wear supportive footwear that is appropriate for your athletic activity.  Avoid athletic activities that cause swelling or pain in your ankle or foot.  See your health care provider if you have pain or swelling that does not improve after a few days of rest.  Stop training if you develop pain or swelling.  If you start a new athletic activity, start gradually to build up your strength, endurance, and flexibility. Contact a health care provider if:  Your symptoms  get worse.  Your symptoms do not improve in 2-4 weeks.  You develop new, unexplained symptoms. This information is not intended to replace advice given to you by your health care provider. Make sure you discuss any questions you have with your health care provider. Document Released: 10/08/2005 Document Revised: 06/12/2016 Document Reviewed: 08/27/2015 Elsevier Interactive Patient Education  2018 [employer]    Posterior Tibialis Tendinosis Rehab Ask your health care provider which exercises are safe for you. Do exercises exactly as told by your health care provider and adjust them as directed. It is normal to feel mild stretching, pulling, tightness, or discomfort as you do these exercises, but you should stop right away if you feel sudden pain or your pain gets worse.Do not begin these exercises until told by your health care provider. Stretching and range of motion exercises These exercises warm up your muscles and joints and improve the movement and flexibility in your ankle and foot. These exercises may also help to relieve pain. Exercise A: Standing wall calf stretch, knee straight  1. Stand with your hands against a wall. 2. Extend your __________ leg behind you, and bend your front knee slightly. Keep both of your heels on the floor. 3. Point the toes of your back foot slightly inward. 4. Keeping your heels on the floor and your back knee straight, shift your weight toward the wall. Do not allow your back to arch. You should feel a gentle stretch in the back of your lower leg (calf). 5. Hold this position for __________ seconds. Repeat __________ times. Complete this stretch __________ times a day. Exercise B: Standing wall calf stretch, knee bent 1. Stand with your hands against a wall. 2. Extend your __________ leg behind you, and bend your front knee slightly. Keep both of your heels on the floor. 3. Point the toes of your back foot slightly inward. 4. Unlock your back knee  so it is bent. Keep your heels on the floor. You should feel a gentle stretch deep in your calf. 5. Hold this position for __________ seconds. Repeat __________ times. Complete this exercise __________ times a day. Strengthening exercises These exercises build strength and endurance in your ankle and foot. Endurance is the ability to use your muscles for a long time, even after they get tired. Exercise C: Ankle inversion with band 1. Secure one end of an exercise band or tubing to a fixed object, such as a table leg or a pole, that will stay still when the band is pulled. to an object that will not move if it is pulled on, like a table leg. 2. Loop the other end of the band around the middle of your left / right foot. 3. Sit on the floor facing the object with your __________ leg extended. The band or tube should be slightly tense when your foot is relaxed. 4. Leading with your big toe, slowly bring your __________ foot and ankle inward, toward your  other foot. 5. Hold this position for __________ seconds. 6. Slowly return your foot to the starting position. Repeat __________ times. Complete this exercise __________ times a day. Exercise D: Towel curls  1. Sit in a chair on a non-carpeted surface, and put your feet on the floor. 2. Place a towel in front of your feet. If told by your health care provider, add __________ at the end of the towel. 3. Keeping your heel on the floor, put your __________ foot on the towel. 4. Pull the towel toward you by grabbing the towel with your toes and curling them under. Keep your heel on the floor. 5. Let your toes relax. 6. Grab the towel with your toes again. Keep going until the towel is completely underneath your foot. Repeat __________ times. Complete this exercise __________ times a day. Balance exercises These exercises improve or maintain your balance. Balance is important in preventing falls. Exercise E: Single leg stand 1. Without shoes, stand  near a railing or in a doorway. You can hold on to the railing or door frame as needed for balance. 2. Stand on your __________ foot. Keep your big toe down on the floor and try to keep your arch lifted. If balancing in this position is too easy, try the exercise with your eyes closed or while standing on a pillow. 3. Hold this position for __________ seconds. Repeat __________ times. Complete this exercise __________ times a day. This information is not intended to replace advice given to you by your health care provider. Make sure you discuss any questions you have with your health care provider. Document Released: 10/08/2005 Document Revised: 06/12/2016 Document Reviewed: 06/24/2015 Elsevier Interactive Patient Education  [employer].

## 2017-12-17 ENCOUNTER — Ambulatory Visit: Payer: BLUE CROSS/BLUE SHIELD | Admitting: Family Medicine

## 2021-12-29 ENCOUNTER — Other Ambulatory Visit: Payer: Self-pay | Admitting: Family Medicine

## 2021-12-29 DIAGNOSIS — R519 Headache, unspecified: Secondary | ICD-10-CM

## 2021-12-29 DIAGNOSIS — M542 Cervicalgia: Secondary | ICD-10-CM

## 2022-01-19 ENCOUNTER — Other Ambulatory Visit: Payer: BLUE CROSS/BLUE SHIELD

## 2022-01-19 ENCOUNTER — Ambulatory Visit
Admission: RE | Admit: 2022-01-19 | Discharge: 2022-01-19 | Disposition: A | Discharge status: Home / Self Care | Payer: 59 | Source: Ambulatory Visit | Attending: Family Medicine | Admitting: Family Medicine

## 2022-01-19 DIAGNOSIS — R519 Headache, unspecified: Secondary | ICD-10-CM

## 2022-01-19 DIAGNOSIS — M542 Cervicalgia: Secondary | ICD-10-CM

## 2022-01-19 MED ORDER — GADOBENATE DIMEGLUMINE 529 MG/ML IV SOLN
17.0000 mL | Freq: Once | INTRAVENOUS | Status: AC | PRN
  Administered 2022-01-19: 17 mL via INTRAVENOUS

## 2022-02-02 ENCOUNTER — Ambulatory Visit
Admission: RE | Admit: 2022-02-02 | Discharge: 2022-02-02 | Disposition: A | Discharge status: Home / Self Care | Payer: 59 | Source: Ambulatory Visit | Attending: Family Medicine | Admitting: Family Medicine

## 2022-02-02 DIAGNOSIS — M542 Cervicalgia: Secondary | ICD-10-CM

## 2022-02-02 DIAGNOSIS — R519 Headache, unspecified: Secondary | ICD-10-CM

## 2022-04-16 IMAGING — MR MR CERVICAL SPINE W/O CM
4 of 5 series · 27 of 48 positions shown · non-contrast
Comparison: None.

CLINICAL DATA: Headaches, neck pain

EXAM:
MRI CERVICAL SPINE WITHOUT CONTRAST
TECHNIQUE: Multiplanar, multisequence MR imaging of the cervical spine was
performed. No intravenous contrast was administered.

[Series 5: T2 · sagittal · 3.0mm · 0.55mm/px · 7 of 15 slices shown (1 of 2)]
[im 1/15]
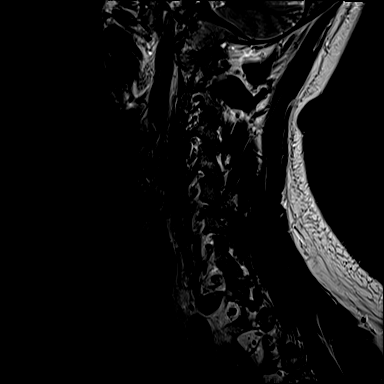
[im 3/15]
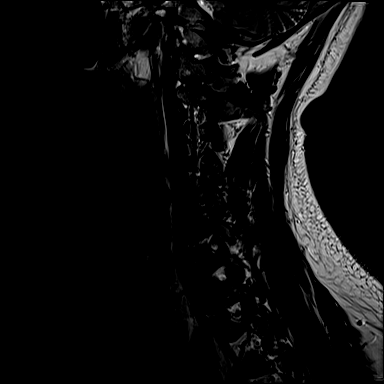
[im 5/15]
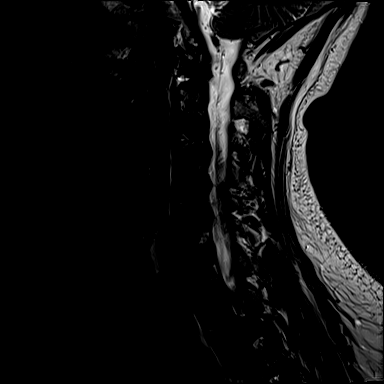
[im 8/15]
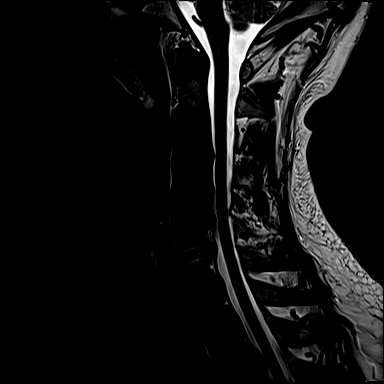
[im 10/15]
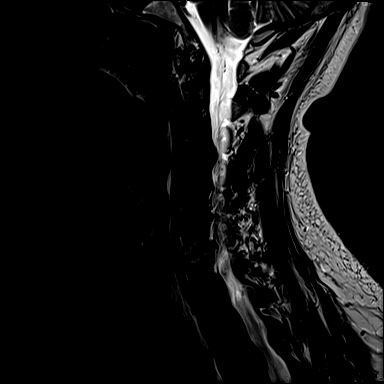
[im 12/15]
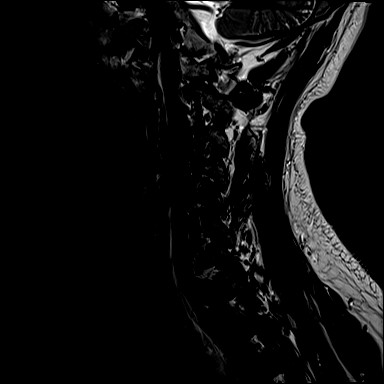
[im 15/15]
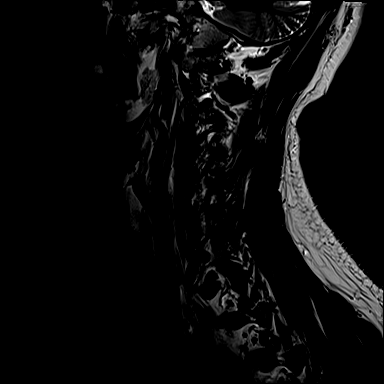

[Series 6: T1 · sagittal · 3.0mm · 0.66mm/px · 7 of 15 slices shown]
[im 1/15]
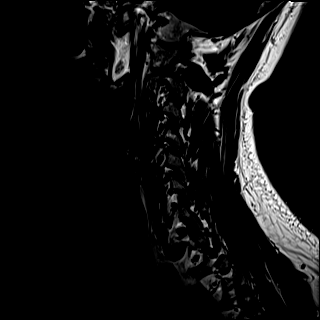
[im 3/15]
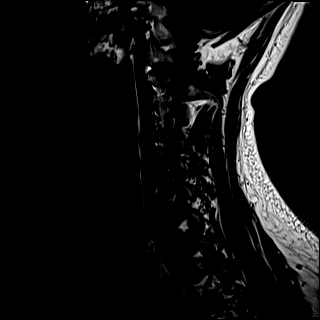
[im 5/15]
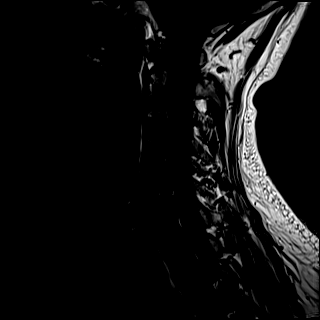
[im 8/15]
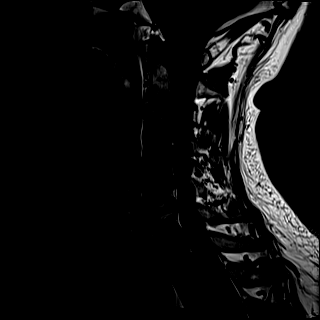
[im 10/15]
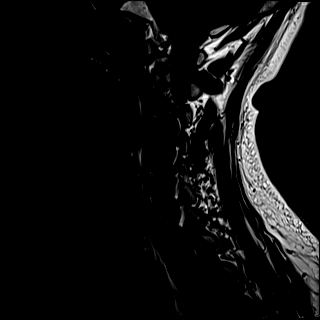
[im 12/15]
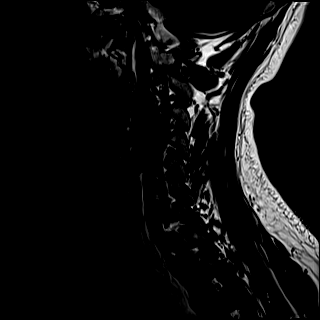
[im 15/15]
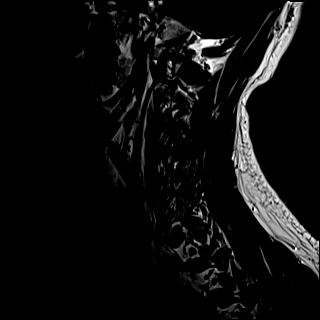

[Series 7: STIR · sagittal · 3.0mm · 0.33mm/px · 5 of 15 slices shown]
[im 1/15]
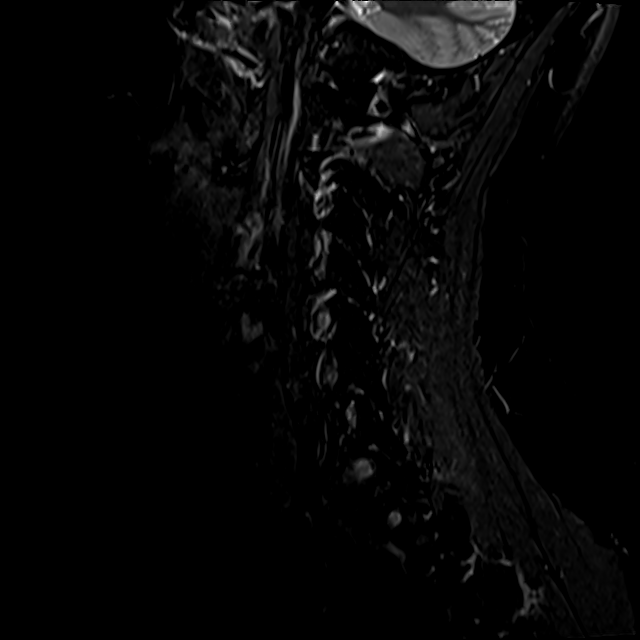
[im 3/15]
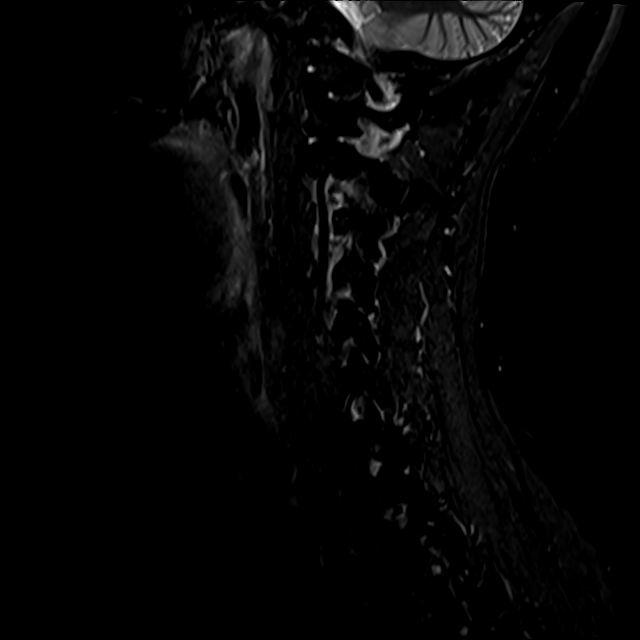
[im 6/15]
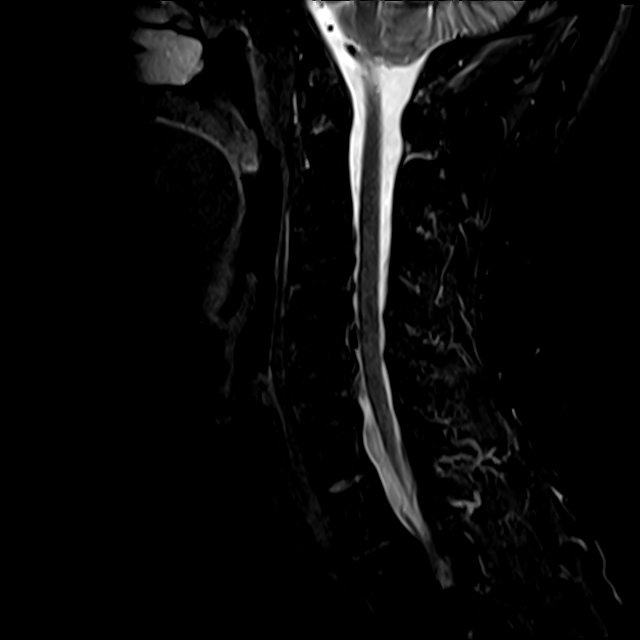
[im 9/15]
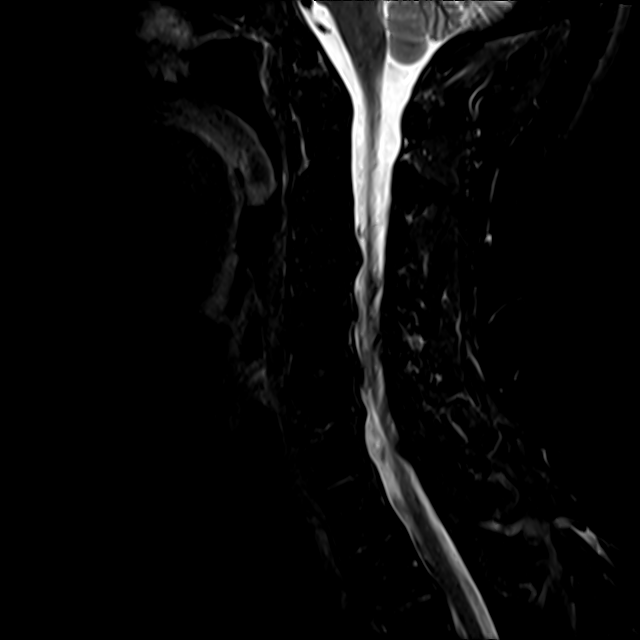
[im 15/15]
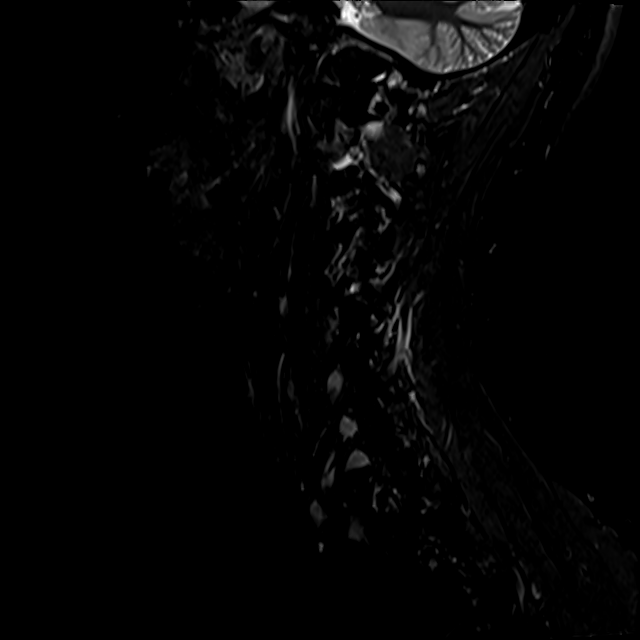

[Series 8: T2 · axial · 3.0mm · 0.50mm/px · z∈[-72,+35]mm · 8 of 34 slices shown (2 of 2)]
[im 1/34]
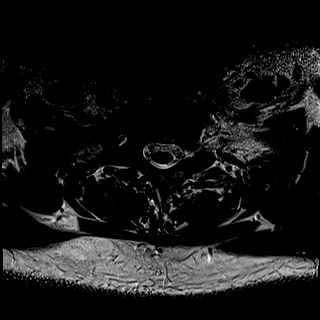
[im 6/34]
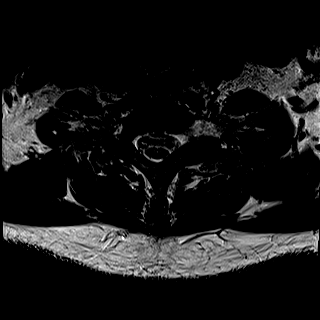
[im 11/34]
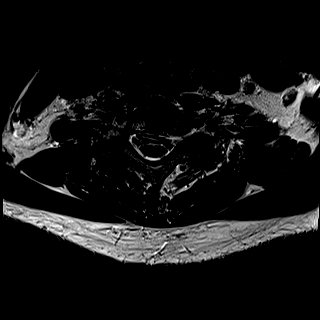
[im 16/34]
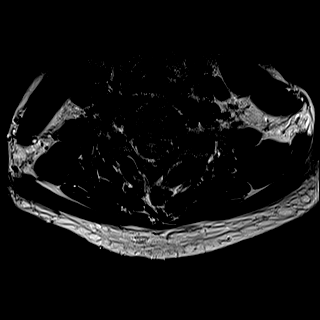
[im 18/34]
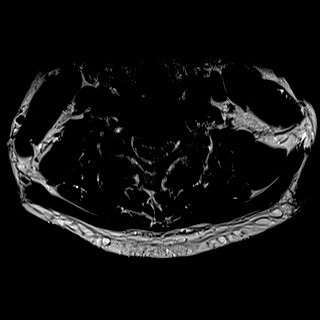
[im 23/34]
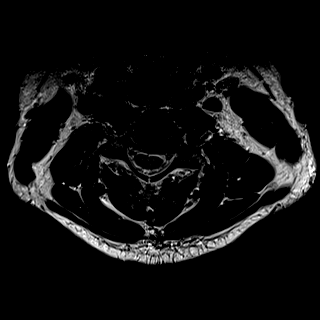
[im 28/34]
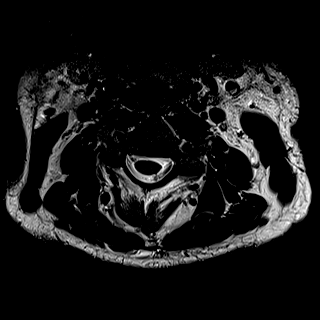
[im 34/34]
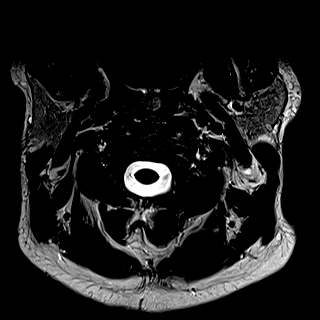

[27 of 48 positions shown; findings below may reference images not displayed]

FINDINGS: Alignment: Normal.

Vertebrae: Vertebral body heights are preserved. Background marrow
signal is normal. There is mild degenerative endplate marrow signal
abnormality at C6-C7. There is no suspicious signal abnormality or
marrow edema.

Cord: Normal in signal and morphology.

Posterior Fossa, vertebral arteries, paraspinal tissues: Imaged
posterior fossa is unremarkable. The vertebral artery flow voids are
normal. Paraspinal soft tissues are unremarkable.

Disc levels:

There is disc space narrowing at C5-C6 and C6-C7, worse at C6-C7.
The other disc heights are overall preserved.

C2-C3: No significant spinal canal or neural foraminal stenosis

C3-C4: There is bilateral uncovertebral ridging resulting in severe
left and mild right neural foraminal stenosis without significant
spinal canal stenosis.

C4-C5: There is a posterior disc osteophyte complex with mild
uncovertebral ridging and mild facet arthropathy resulting in
moderate left and mild right neural foraminal stenosis without
significant spinal canal stenosis

C5-C6: There is a posterior disc osteophyte complex with a
broad-based protrusion, bilateral uncovertebral ridging, and mild
bilateral facet arthropathy resulting in severe bilateral neural
foraminal stenosis and mild spinal canal stenosis.

C6-C7: There is left worse than right uncovertebral ridging and mild
facet arthropathy resulting in moderate to severe left and no
significant right neural foraminal stenosis and no significant
spinal canal stenosis

C7-T1: No significant spinal canal or neural foraminal stenosis.
IMPRESSION: Multilevel degenerative changes throughout the cervical spine as
above resulting in severe left neural foraminal stenosis at C3-C4,
moderate left neural foraminal stenosis at C4-C5, severe bilateral
neural foraminal stenosis and mild spinal canal stenosis at C5-C6,
and severe left neural foraminal stenosis at C6-C7.

## 2022-06-23 ENCOUNTER — Ambulatory Visit
Admission: EM | Admit: 2022-06-23 | Discharge: 2022-06-23 | Disposition: A | Discharge status: Home / Self Care | Payer: 59 | Attending: Family Medicine | Admitting: Family Medicine

## 2022-06-23 ENCOUNTER — Encounter: Payer: Self-pay | Admitting: Emergency Medicine

## 2022-06-23 DIAGNOSIS — L237 Allergic contact dermatitis due to plants, except food: Secondary | ICD-10-CM

## 2022-06-23 MED ORDER — METHYLPREDNISOLONE ACETATE 80 MG/ML IJ SUSP
80.0000 mg | Freq: Once | INTRAMUSCULAR | Status: AC
  Administered 2022-06-23: 80 mg via INTRAMUSCULAR

## 2022-06-23 NOTE — Discharge Instructions (Signed)
Continue calamine Call if worse Shot of prednisone should help resolve the swelling and rash

## 2022-06-23 NOTE — ED Provider Notes (Signed)
[facility]    CSN: [id number] Arrival date & time: 06/23/22  1123      History   Chief Complaint Chief Complaint  Patient presents with   Rash    HPI Tony Brady is a 56 y.o. male.   HPI  Patient states he is very allergic to poison ivy.  He has a poison ivy rash on his ankle for 4 days.  He is using calamine.  He is coming to visit today because he woke up with a diffusely swollen ankle.  He is concerned about soft tissue swelling.  It is uncomfortable but not painful.  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 Authorizing Provider  atorvastatin (LIPITOR) 10 MG tablet Take 10 mg by mouth daily. 06/09/22  Yes [provider]  Multiple Vitamin (THERA) TABS Take 1 tablet by mouth daily.   Yes [provider]  Omega-3 Fatty Acids (FP FISH OIL PO) Take by mouth.   Yes [provider]    Family History Family History  Problem Relation Age of Onset   Colon cancer Mother    COPD Mother    Pneumonia Father    Cancer Father    Emphysema Father     Social History Social History   Tobacco Use   Smoking status: Never   Smokeless tobacco: Never  Vaping Use   Vaping Use: Never used  Substance Use Topics   Alcohol use: Yes    Comment: rare, one glass a year   Drug use: Never     Allergies   Patient has no known allergies.   Review of Systems Review of Systems See HPI  Physical Exam Triage Vital Signs ED Triage Vitals  Enc Vitals Group      BP 06/23/22 1137 126/85     Pulse Rate 06/23/22 1137 77     Resp 06/23/22 1137 18     Temp 06/23/22 1137 98.6 F (37 C)     Temp Source 06/23/22 1137 Oral     SpO2 06/23/22 1137 96 %     Weight 06/23/22 1139 190 lb (86.2 kg)     Height 06/23/22 1139 5\' 11"  (1.803 m)     Head Circumference --      Peak Flow --      Pain Score 06/23/22 1138 0     Pain Loc --      Pain Edu? --      Excl. in GC? --    No data found.  Updated Vital Signs BP 126/85 (BP Location: Left Arm)   Pulse 77   Temp 98.6 F (37 C) (Oral)   Resp 18   Ht 5\' 11"  (1.803 m)   Wt 86.2 kg   SpO2 96%   BMI 26.50 kg/m      Physical Exam Constitutional:      General: He is not in acute distress.    Appearance: He is  well-developed.  HENT:     Head: Normocephalic and atraumatic.  Eyes:     Conjunctiva/sclera: Conjunctivae normal.     Pupils: Pupils are equal, round, and reactive to light.  Cardiovascular:     Rate and Rhythm: Normal rate.  Pulmonary:     Effort: Pulmonary effort is normal. No respiratory distress.  Abdominal:     General: There is no distension.     Palpations: Abdomen is soft.  Musculoskeletal:        General: Normal range of motion.     Cervical back: Normal range of motion.  Skin:    General: Skin is warm and dry.     Comments: There is an erythematous patch on the left ankle, medial area, with weeping vesicles.  There is soft tissue swelling diffusely around the ankle.  No pain with ankle movement.  No tenderness  Neurological:     Mental Status: He is alert.      UC Treatments / Results  Labs (all labs ordered are listed, but only abnormal results are displayed) Labs Reviewed - No data to display  EKG   Radiology No results found.  Procedures Procedures (including critical care time)  Medications Ordered in UC Medications  methylPREDNISolone acetate (DEPO-MEDROL) injection 80 mg (80 mg Intramuscular Given 06/23/22 1204)    Initial Impression / Assessment and  Plan / UC Course  I have reviewed the triage vital signs and the nursing notes.  Pertinent labs & imaging results that were available during my care of the patient were reviewed by me and considered in my medical decision making (see chart for details).     Final Clinical Impressions(s) / UC Diagnoses   Final diagnoses:  Allergic contact dermatitis due to plants, except food     Discharge Instructions      Continue calamine Call if worse Shot of prednisone should help resolve the swelling and rash    ED Prescriptions   None    PDMP not reviewed this encounter.   Tony Moore, MD 06/23/22 [phone]

## 2022-06-23 NOTE — ED Triage Notes (Signed)
Patient c/o a rash on his left ankle x 4 days after going into the woods.  Patient states that he noticed some swelling x 2 days ago.  There is some itching.  Patient has been applying Calamine lotion to the area.

## 2024-04-09 ENCOUNTER — Other Ambulatory Visit: Payer: Self-pay | Admitting: Family Medicine

## 2024-04-09 DIAGNOSIS — E7800 Pure hypercholesterolemia, unspecified: Secondary | ICD-10-CM

## 2024-04-22 ENCOUNTER — Ambulatory Visit (INDEPENDENT_AMBULATORY_CARE_PROVIDER_SITE_OTHER): Payer: Self-pay

## 2024-04-22 DIAGNOSIS — E7800 Pure hypercholesterolemia, unspecified: Secondary | ICD-10-CM

## 2024-08-12 ENCOUNTER — Ambulatory Visit: Admission: EM | Admit: 2024-08-12 | Discharge: 2024-08-12 | Disposition: A | Discharge status: Home / Self Care

## 2024-08-12 DIAGNOSIS — M62830 Muscle spasm of back: Secondary | ICD-10-CM | POA: Diagnosis not present

## 2024-08-12 DIAGNOSIS — S39012A Strain of muscle, fascia and tendon of lower back, initial encounter: Secondary | ICD-10-CM | POA: Diagnosis not present

## 2024-08-12 MED ORDER — METHOCARBAMOL 500 MG PO TABS: 500.0000 mg | ORAL_TABLET | Freq: Two times a day (BID) | ORAL | 0 refills | Status: AC

## 2024-08-12 MED ORDER — METHYLPREDNISOLONE SODIUM SUCC 125 MG IJ SOLR
125.0000 mg | Freq: Once | INTRAMUSCULAR | Status: AC
  Administered 2024-08-12: 125 mg via INTRAMUSCULAR

## 2024-08-12 MED ORDER — PREDNISONE 10 MG (21) PO TBPK: ORAL_TABLET | Freq: Every day | ORAL | 0 refills | Status: AC

## 2024-08-12 NOTE — ED Triage Notes (Signed)
 Pt presents to uc with co of back pain for 2 weeks. Pt reports he was trying to get out of a chair and thinks he strained it then. Pt reports he used an icy hot patch which he had an allergic reaction and symptoms improved and reported worsening since yesterday. Pt has been using motrin .

## 2024-08-12 NOTE — ED Provider Notes (Signed)
 [facility]    CSN: [id number] Arrival date & time: 08/12/24  1214      History   Chief Complaint No chief complaint on file.   HPI Tony Brady is a 58 y.o. male.   HPI 58 year old male presents with lower back pain for 2 weeks.  Reports he may have strained it getting out of his chair.  PMH significant for chest pain, hypertriglyceridemia, and chronic neck pain.  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 Authorizing Provider  amLODipine (NORVASC) 10 MG tablet Take 10 mg by mouth daily. 05/21/24  Yes [provider]  methocarbamol (ROBAXIN) 500 MG tablet Take 1 tablet (500 mg total) by mouth 2 (two) times daily. 08/12/24  Yes Teddy Sharper, FNP  predniSONE (STERAPRED UNI-PAK 21 TAB) 10 MG (21) TBPK tablet Take by mouth daily. Take 6 tabs by mouth daily  for 2 days, then 5 tabs for 2 days, then 4 tabs for 2 days, then 3 tabs for 2 days, 2 tabs for 2 days, then 1 tab by mouth daily for 2 days 08/12/24  Yes Teddy Sharper, FNP  Multiple Vitamin (THERA) TABS Take 1 tablet by mouth daily.    [provider]  Omega-3 Fatty Acids (FP FISH OIL PO) Take by mouth.    [provider]    Family History Family History  Problem Relation Age of Onset   Colon cancer Mother    COPD Mother    Pneumonia Father    Cancer Father    Emphysema Father     Social History Social History   Tobacco Use   Smoking status: Never   Smokeless tobacco: Never  Vaping Use    Vaping status: Never Used  Substance Use Topics   Alcohol use: Yes    Comment: 1-2 a week   Drug use: Never     Allergies   Atorvastatin   Review of Systems Review of Systems  Musculoskeletal:  Positive for back pain.  All other systems reviewed and are negative.    Physical Exam Triage Vital Signs ED Triage Vitals  Encounter Vitals Group     BP      Girls Systolic BP Percentile      Girls Diastolic BP Percentile      Boys Systolic BP Percentile      Boys Diastolic BP Percentile      Pulse      Resp      Temp      Temp src      SpO2      Weight      Height      Head Circumference      Peak Flow      Pain Score      Pain Loc      Pain Education      Exclude from Growth Chart    No data found.  Updated Vital Signs BP ROLLEN)  148/81   Pulse 64   Temp 98.1 F (36.7 C)   Resp 19   SpO2 98%   Visual Acuity Right Eye Distance:   Left Eye Distance:   Bilateral Distance:    Right Eye Near:   Left Eye Near:    Bilateral Near:     Physical Exam Vitals and nursing note reviewed.  Constitutional:      Appearance: Normal appearance. He is normal weight.  HENT:     Head: Normocephalic and atraumatic.     Mouth/Throat:     Mouth: Mucous membranes are moist.     Pharynx: Oropharynx is clear.  Eyes:     Extraocular Movements: Extraocular movements intact.     Pupils: Pupils are equal, round, and reactive to light.  Cardiovascular:     Rate and Rhythm: Normal rate and regular rhythm.     Heart sounds: Normal heart sounds.  Pulmonary:     Effort: Pulmonary effort is normal.     Breath sounds: Normal breath sounds. No wheezing, rhonchi or rales.  Musculoskeletal:        General: Normal range of motion.     Comments: Left sided lower back: TTP over superior spinal erectors, no deformity noted  Skin:    General: Skin is warm and dry.  Neurological:     General: No focal deficit present.     Mental Status: He is alert and oriented to person, place, and time.   Psychiatric:        Mood and Affect: Mood normal.        Behavior: Behavior normal.      UC Treatments / Results  Labs (all labs ordered are listed, but only abnormal results are displayed) Labs Reviewed - No data to display  EKG   Radiology No results found.  Procedures Procedures (including critical care time)  Medications Ordered in UC Medications  methylPREDNISolone  sodium succinate (SOLU-MEDROL ) 125 mg/2 mL injection 125 mg (125 mg Intramuscular Given 08/12/24 1258)    Initial Impression / Assessment and Plan / UC Course  I have reviewed the triage vital signs and the nursing notes.  Pertinent labs & imaging results that were available during my care of the patient were reviewed by me and considered in my medical decision making (see chart for details).     MDM: 1.  Strain of lumbar region, initial encounter-IM Solu-Medrol  125 mg given once in clinic and prior to discharge, Rx'd Sterapred Unipak (42 tab 10 mg taper) take as directed; 2.  Spasm of back muscles-Rx'd Robaxin 500 mg tablet: Take 1 tablet twice daily, as needed. Advised patient to take medication (Sterapred Unipak) as directed with food to completion.  Advised patient may take Robaxin 1-2 times daily, as needed for muscle spasms of back.  Encouraged to increase daily water intake to 64 ounces per day while taking these medications.  Advised if symptoms worsen and/or unresolved please follow-up with your PCP or here for further evaluation.  Patient discharged home, hemodynamically stable. Final Clinical Impressions(s) / UC Diagnoses   Final diagnoses:  Strain of lumbar region, initial encounter  Spasm of back muscles     Discharge Instructions      Advised patient to take medication (Sterapred Unipak) as directed with food to completion.  Advised patient may take Robaxin 1-2 times daily, as needed for muscle spasms of back.  Encouraged to increase daily water intake to 64 ounces per day while taking  these medications.  Advised if symptoms worsen  and/or unresolved please follow-up with your PCP or here for further evaluation.     ED Prescriptions     Medication Sig Dispense Auth. Provider   predniSONE (STERAPRED UNI-PAK 21 TAB) 10 MG (21) TBPK tablet Take by mouth daily. Take 6 tabs by mouth daily  for 2 days, then 5 tabs for 2 days, then 4 tabs for 2 days, then 3 tabs for 2 days, 2 tabs for 2 days, then 1 tab by mouth daily for 2 days 42 tablet Teddy Sharper, FNP   methocarbamol (ROBAXIN) 500 MG tablet Take 1 tablet (500 mg total) by mouth 2 (two) times daily. 20 tablet Azavion Bouillon, FNP      PDMP not reviewed this encounter.   Teddy Sharper, FNP 08/12/24 1311

## 2024-08-12 NOTE — Discharge Instructions (Addendum)
 Advised patient to take medication (Sterapred Unipak) as directed with food to completion.  Advised patient may take Robaxin 1-2 times daily, as needed for muscle spasms of back.  Encouraged to increase daily water intake to 64 ounces per day while taking these medications.  Advised if symptoms worsen and/or unresolved please follow-up with your PCP or here for further evaluation.
